# Patient Record
Sex: Male | Born: 1985 | Race: White | Hispanic: No | Marital: Single | Smoking: Never smoker
Health system: Southern US, Community
[De-identification: ages and names within clinical notes are randomized; demographics above are authoritative.]

---
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
## 2013-11-26 ENCOUNTER — Ambulatory Visit: Payer: 59

## 2013-11-26 ENCOUNTER — Ambulatory Visit: Payer: 59 | Admitting: Family Medicine

## 2013-11-26 VITALS — BP 102/68 | HR 80 | Temp 98.1°F | Resp 18 | Wt 125.0 lb

## 2013-11-26 DIAGNOSIS — T148XXA Other injury of unspecified body region, initial encounter: Secondary | ICD-10-CM

## 2013-11-26 DIAGNOSIS — R0781 Pleurodynia: Secondary | ICD-10-CM

## 2013-11-26 DIAGNOSIS — R079 Chest pain, unspecified: Secondary | ICD-10-CM

## 2013-11-26 NOTE — Patient Instructions (Signed)
Rib Contusion A rib contusion (bruise) can occur by a blow to the chest or by a fall against a hard object. Usually these will be much better in a couple weeks. If X-rays were taken today and there are no broken bones (fractures), the diagnosis of bruising is made. However, broken ribs may not show up for several days, or may be discovered later on a routine X-ray when signs of healing show up. If this happens to you, it does not mean that something was missed on the X-ray, but simply that it did not show up on the first X-rays. Earlier diagnosis will not usually change the treatment. HOME CARE INSTRUCTIONS   Avoid strenuous activity. Be careful during activities and avoid bumping the injured ribs. Activities that pull on the injured ribs and cause pain should be avoided, if possible.  For the first day or two, an ice pack used every 20 minutes while awake may be helpful. Put ice in a plastic bag and put a towel between the bag and the skin.  Eat a normal, well-balanced diet. Drink plenty of fluids to avoid constipation.  Take deep breaths several times a day to keep lungs free of infection. Try to cough several times a day. Splint the injured area with a pillow while coughing to ease pain. Coughing can help prevent pneumonia.  Wear a rib belt or binder only if told to do so by your caregiver. If you are wearing a rib belt or binder, you must do the breathing exercises as directed by your caregiver. If not used properly, rib belts or binders restrict breathing which can lead to pneumonia.  Only take over-the-counter or prescription medicines for pain, discomfort, or fever as directed by your caregiver. SEEK MEDICAL CARE IF:   You or your child has an oral temperature above 102 F (38.9 C).  Your baby is older than 3 months with a rectal temperature of 100.5 F (38.1 C) or higher for more than 1 day.  You develop a cough, with thick or bloody sputum. SEEK IMMEDIATE MEDICAL CARE IF:   You  have difficulty breathing.  You feel sick to your stomach (nausea), have vomiting or belly (abdominal) pain.  You have worsening pain, not controlled with medications, or there is a change in the location of the pain.  You develop sweating or radiation of the pain into the arms, jaw or shoulders, or become light headed or faint.  You or your child has an oral temperature above 102 F (38.9 C), not controlled by medicine.  Your or your baby is older than 3 months with a rectal temperature of 102 F (38.9 C) or higher.  Your baby is 683 months old or younger with a rectal temperature of 100.4 F (38 C) or higher. MAKE SURE YOU:   Understand these instructions.  Will watch your condition.  Will get help right away if you are not doing well or get worse. Document Released: 03/28/2001 Document Revised: 10/28/2012 Document Reviewed: 02/19/2008 [facility] Patient Information 2014 [place], [place]. Chest Contusion A chest contusion is a deep bruise on your chest area. Contusions are the result of an injury that caused bleeding under the skin. A chest contusion may involve bruising of the skin, muscles, or ribs. The contusion may turn blue, purple, or yellow. Minor injuries will give you a painless contusion, but more severe contusions may stay painful and swollen for a few weeks. CAUSES  A contusion is usually caused by a blow, trauma, or direct  force to an area of the body. SYMPTOMS   Swelling and redness of the injured area.  Discoloration of the injured area.  Tenderness and soreness of the injured area.  Pain. DIAGNOSIS  The diagnosis can be made by taking a history and performing a physical exam. An X-ray, CT scan, or MRI may be needed to determine if there were any associated injuries, such as broken bones (fractures) or internal injuries. TREATMENT  Often, the best treatment for a chest contusion is resting, icing, and applying cold compresses to the injured area. Deep breathing  exercises may be recommended to reduce the risk of pneumonia. Over-the-counter medicines may also be recommended for pain control. HOME CARE INSTRUCTIONS   Put ice on the injured area.  Put ice in a plastic bag.  Place a towel between your skin and the bag.  Leave the ice on for 15-20 minutes, 03-04 times a day.  Only take over-the-counter or prescription medicines as directed by your caregiver. Your caregiver may recommend avoiding anti-inflammatory medicines (aspirin, ibuprofen, and naproxen) for 48 hours because these medicines may increase bruising.  Rest the injured area.  Perform deep-breathing exercises as directed by your caregiver.  Stop smoking if you smoke.  Do not lift objects over 5 pounds (2.3 kg) for 3 days or longer if recommended by your caregiver. SEEK IMMEDIATE MEDICAL CARE IF:   You have increased bruising or swelling.  You have pain that is getting worse.  You have difficulty breathing.  You have dizziness, weakness, or fainting.  You have blood in your urine or stool.  You cough up or vomit blood.  Your swelling or pain is not relieved with medicines. MAKE SURE YOU:   Understand these instructions.  Will watch your condition.  Will get help right away if you are not doing well or get worse. Document Released: 03/28/2001 Document Revised: 03/27/2012 Document Reviewed: 12/25/2011 [facility] Patient Information 2014 [place], [place].

## 2013-11-26 NOTE — Progress Notes (Signed)
      Chief Complaint:  Chief Complaint  Patient presents with  . Chest Pain    rib pain    HPI: Alejandro Manning is a 28 y.o. male who is here for  Left rib pain and chest pressure since 5 days ago, last Thursdya after he fell during ice skating, he hit the ground and did a "superman" across the ice.  He has pain on the left side and also diffuse pressure on both side. He has pain with expiration. Did not feel it until 2 days later. Today he almost passed out because he was in so much pain. He has not taken anything for it.  No prior broken ribs.   No past medical history on file. No past surgical history on file. History   Social History  . Marital Status: Single    Spouse Name: N/A    Number of Children: N/A  . Years of Education: N/A   Social History Main Topics  . Smoking status: Never Smoker   . Smokeless tobacco: None  . Alcohol Use: No  . Drug Use: No  . Sexual Activity: None   Other Topics Concern  . None   Social History Narrative  . None   No family history on file. No Known Allergies Prior to Admission medications   Not on File     ROS: The patient denies fevers, chills, night sweats, unintentional weight loss, dyspnea on exertion, nausea, vomiting, abdominal pain, dysuria, hematuria, melena, numbness, weakness, or tingling.   All other systems have been reviewed and were otherwise negative with the exception of those mentioned in the HPI and as above.    PHYSICAL EXAM: Filed Vitals:   11/26/13 1435  BP: 102/68  Pulse: 80  Temp: 98.1 F (36.7 C)  Resp: 18   Filed Vitals:   11/26/13 1435  Weight: 125 lb (56.7 kg)   There is no height on file to calculate BMI.  General: Alert, no acute distress HEENT:  Normocephalic, atraumatic, oropharynx patent. EOMI, PERRLA Cardiovascular:  Regular rate and rhythm, no rubs murmurs or gallops.  No Carotid bruits, radial pulse intact. No pedal edema.  Tneder along rib cage bilaterally on  palpation Respiratory: Clear to auscultation bilaterally.  No wheezes, rales, or rhonchi.  No cyanosis, no use of accessory musculature GI: No organomegaly, abdomen is soft and non-tender, positive bowel sounds.  No masses. Skin: No rashes. Neurologic: Facial musculature symmetric. Psychiatric: Patient is appropriate throughout our interaction. Lymphatic: No cervical lymphadenopathy Musculoskeletal: Gait intact.   LABS: No results found for this or any previous visit.   EKG/XRAY:   [facility] Neg for fx/dislocation   ASSESSMENT/PLAN: Encounter Diagnoses  Name Primary?  . Rib pain on left side Yes  . Contusion of unspecified site    OTC ibuprofen F/u prn  Gross sideeffects, risk and benefits, and alternatives of medications d/w patient. Patient is aware that all medications have potential sideeffects and we are unable to predict every sideeffect or drug-drug interaction that may occur.  Alejandro Antuhao P Kloey Cazarez, DO 11/26/2013 3:52 PM

## 2015-05-21 IMAGING — CR DG RIBS W/ CHEST 3+V*L*
3 series · 3 of 3 positions shown · non-contrast
Comparison: None.

CLINICAL DATA: Left rib pain and chest pressure, fall

EXAM:
LEFT RIBS AND CHEST - 3+ VIEW

[PA]
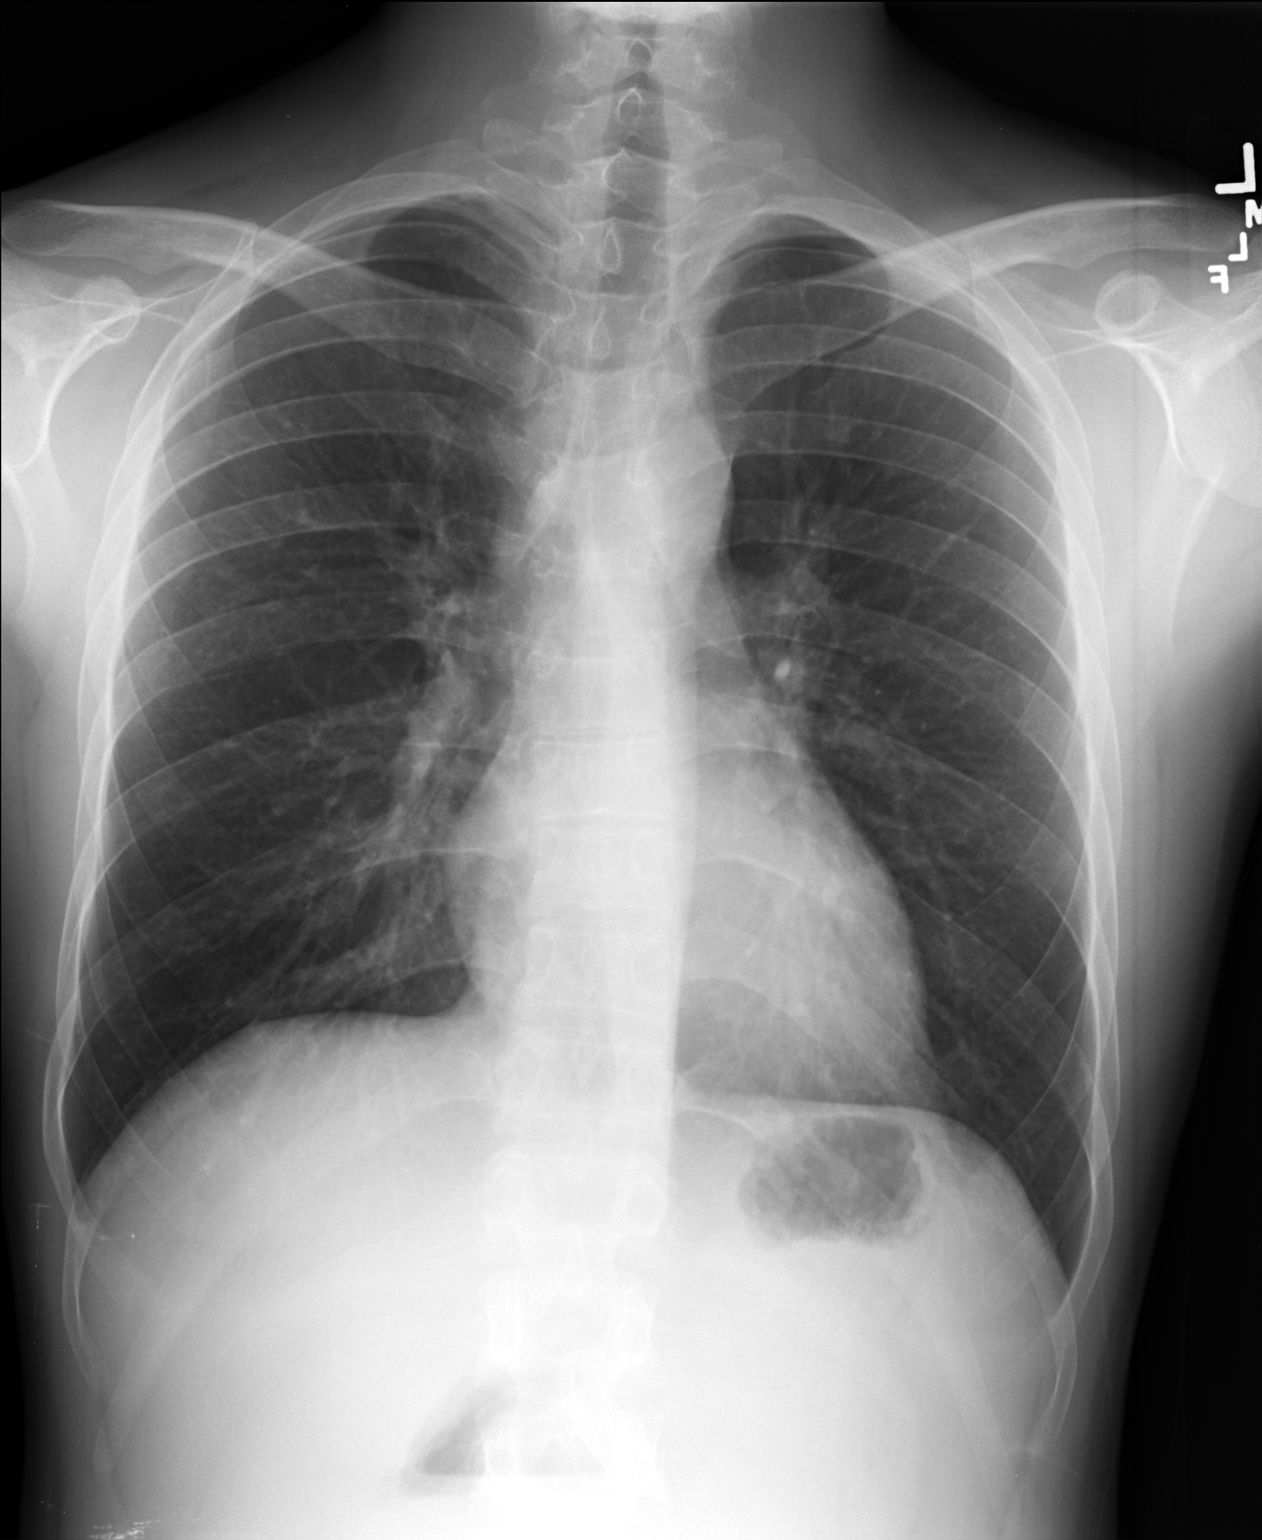

[AP (1 of 2)]
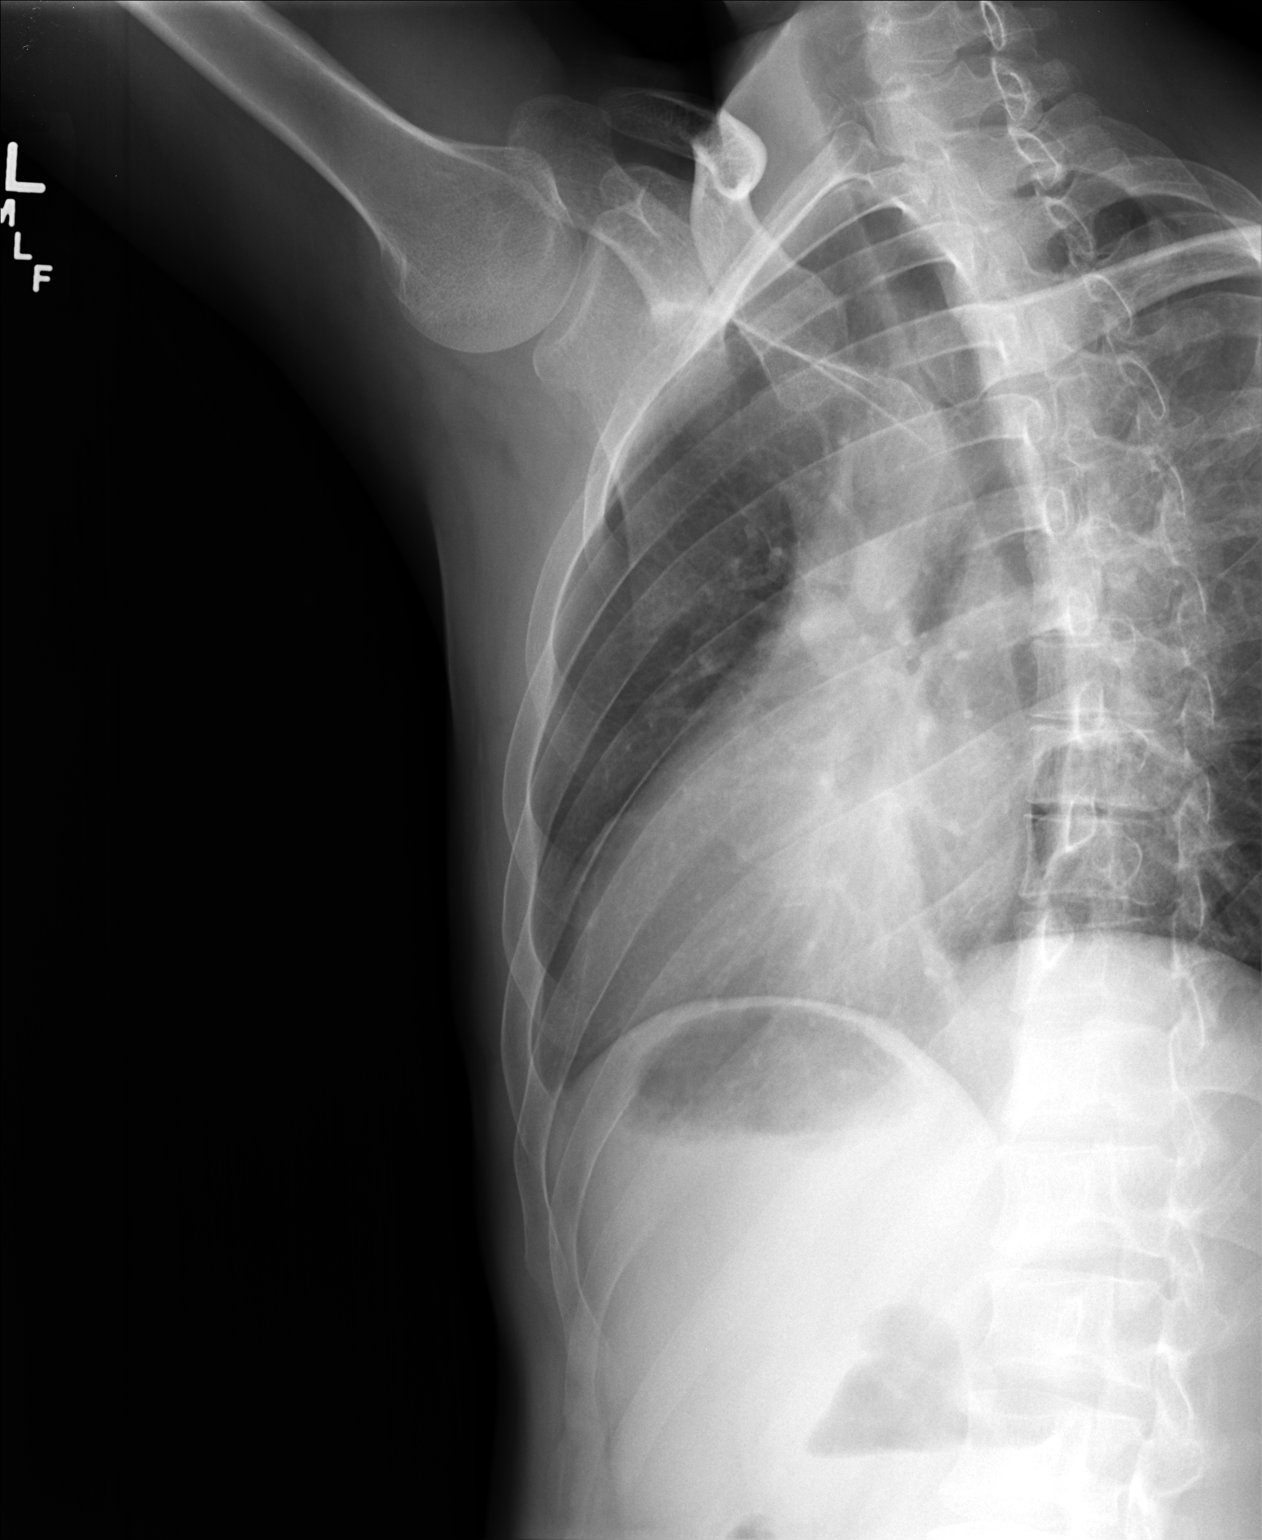

[AP (2 of 2)]
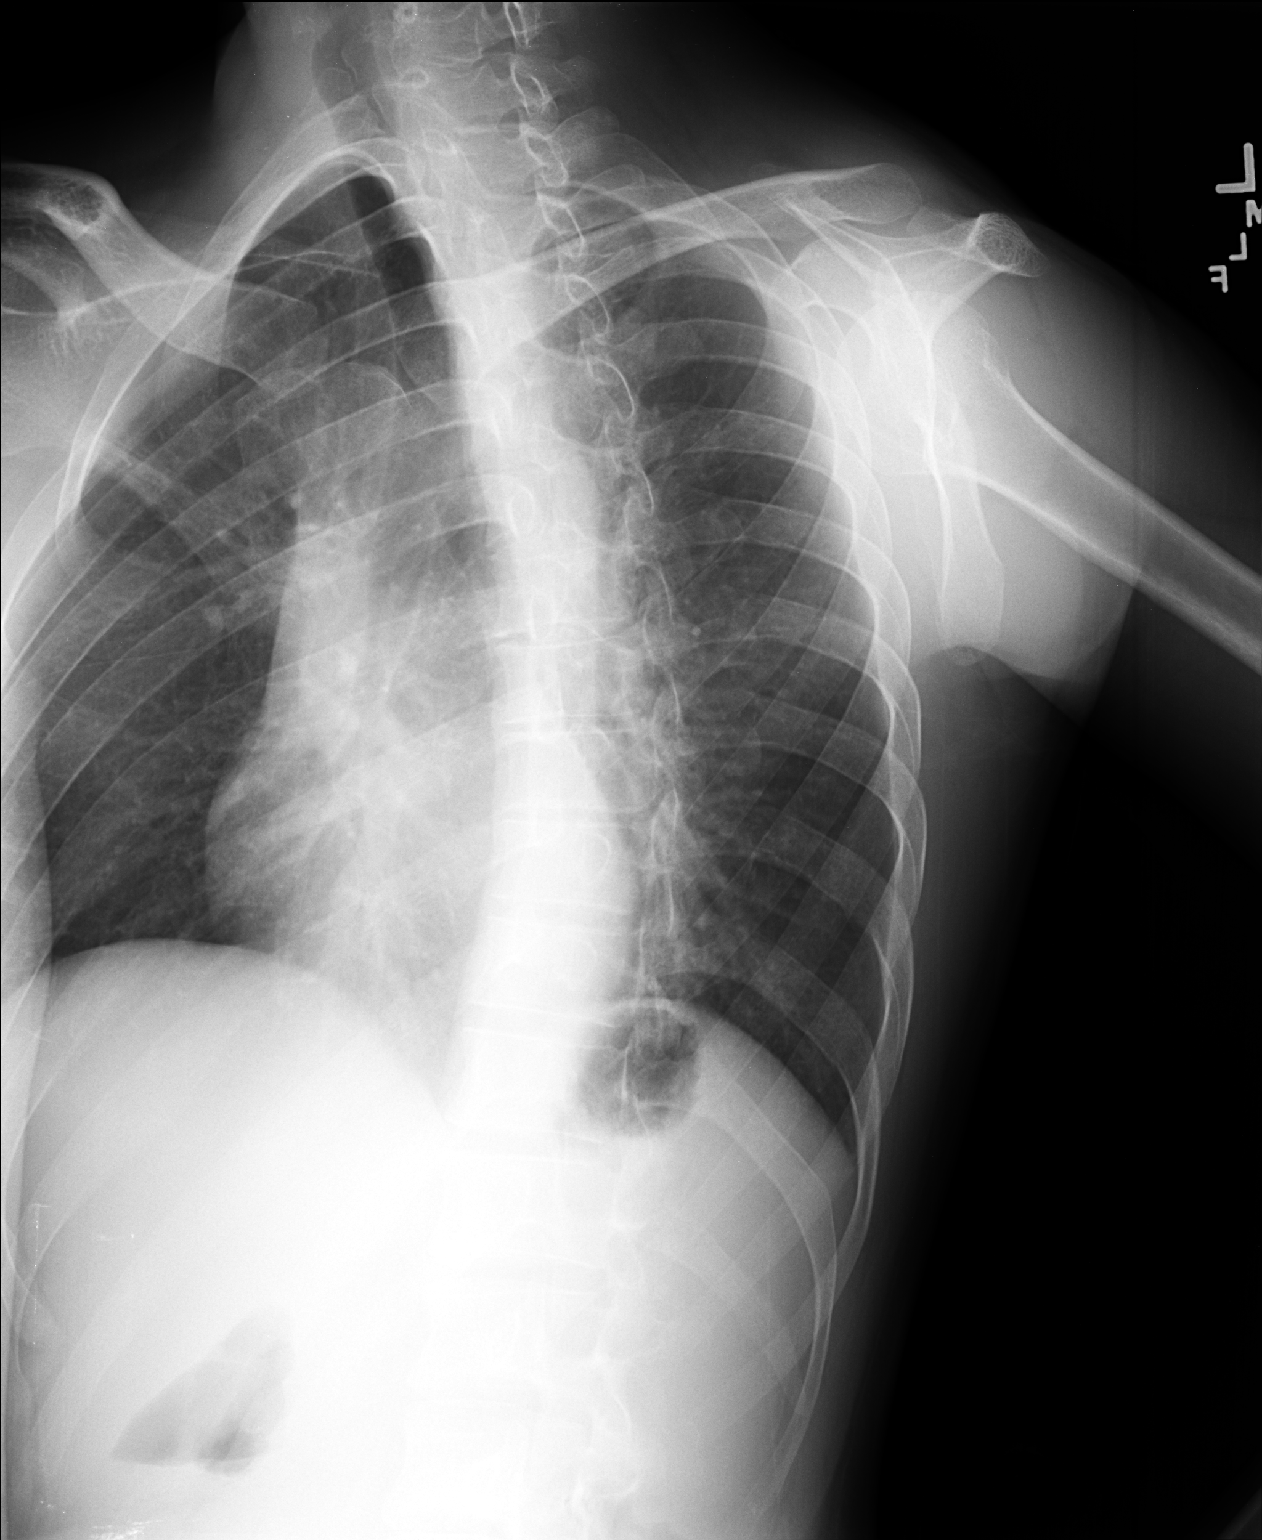

[3 of 3 positions shown; findings below may reference images not displayed]

FINDINGS: No fracture or other bone lesions are seen involving the ribs. There
is no evidence of pneumothorax or pleural effusion. Both lungs are
clear. Heart size and mediastinal contours are within normal limits.
IMPRESSION: Negative.
